# Patient Record
Sex: Female | Born: 1975 | Race: White | Hispanic: No | Marital: Married | Smoking: Former smoker
Health system: Southern US, Community
[De-identification: ages and names within clinical notes are randomized; demographics above are authoritative.]

## PROBLEM LIST (undated history)

## (undated) DIAGNOSIS — R51 Headache: Secondary | ICD-10-CM

---

## 2012-04-27 ENCOUNTER — Ambulatory Visit
Admission: RE | Admit: 2012-04-27 | Discharge: 2012-04-27 | Disposition: A | Discharge status: Home / Self Care | Payer: No Typology Code available for payment source | Source: Ambulatory Visit | Attending: Infectious Diseases | Admitting: Infectious Diseases

## 2012-04-27 ENCOUNTER — Other Ambulatory Visit: Payer: Self-pay | Admitting: Infectious Diseases

## 2012-04-27 DIAGNOSIS — R7611 Nonspecific reaction to tuberculin skin test without active tuberculosis: Secondary | ICD-10-CM

## 2013-10-10 IMAGING — CR DG CHEST 1V
1 series · 1 of 1 positions shown · non-contrast
Comparison: None.

CLINICAL DATA: Positive PPD

CHEST - 1 VIEW

[view not recorded]
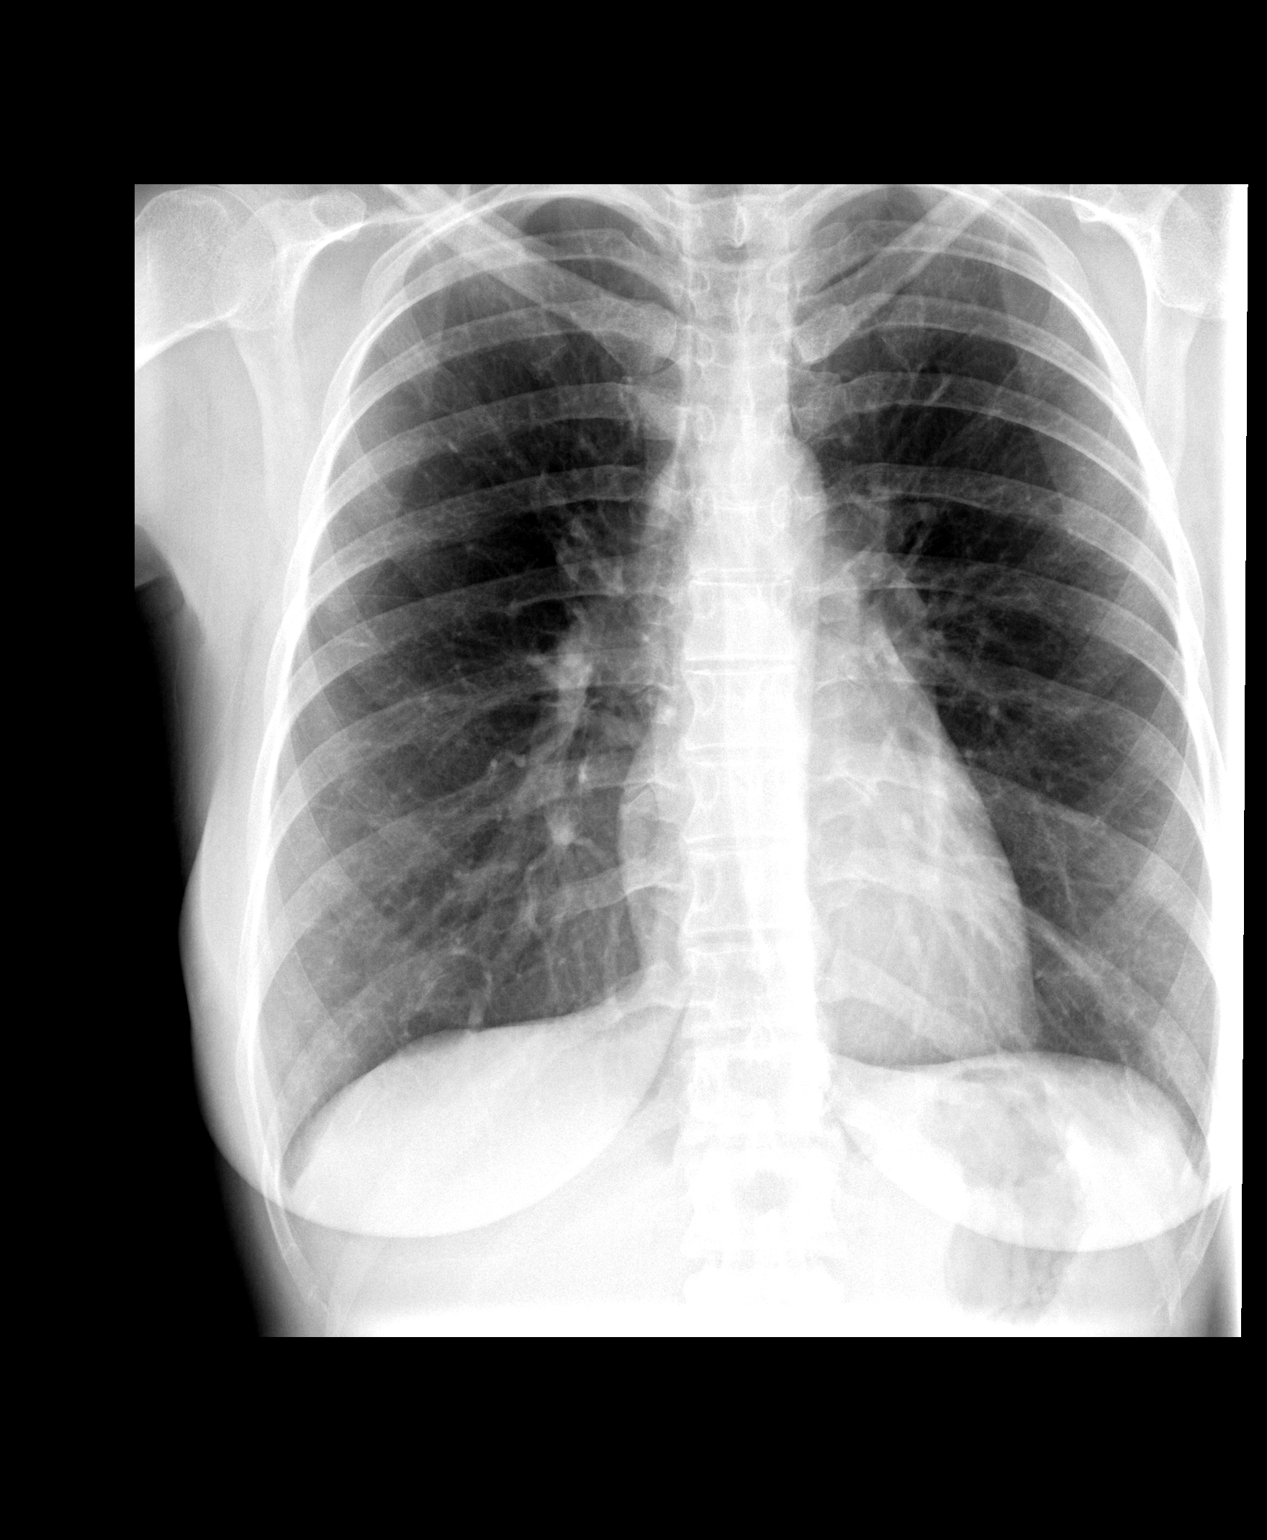

[1 of 1 positions shown; findings below may reference images not displayed]

FINDINGS: Heart size is normal.  Vascularity is normal.  Lungs are
clear without infiltrate or effusion.  No evidence of active
tuberculosis.
IMPRESSION: No active cardiopulmonary disease.

## 2013-12-01 ENCOUNTER — Encounter (HOSPITAL_COMMUNITY): Payer: Self-pay | Admitting: Pharmacist

## 2013-12-01 ENCOUNTER — Encounter (HOSPITAL_COMMUNITY): Payer: Self-pay

## 2013-12-03 ENCOUNTER — Other Ambulatory Visit: Payer: Self-pay | Admitting: Obstetrics & Gynecology

## 2013-12-08 ENCOUNTER — Ambulatory Visit (HOSPITAL_COMMUNITY)
Admission: RE | Admit: 2013-12-08 | Discharge: 2013-12-08 | Disposition: A | Discharge status: Home / Self Care | Payer: BC Managed Care – PPO | Source: Ambulatory Visit | Attending: Obstetrics & Gynecology | Admitting: Obstetrics & Gynecology

## 2013-12-08 ENCOUNTER — Encounter (HOSPITAL_COMMUNITY)
Admission: RE | Disposition: A | Discharge status: Home / Self Care | Payer: Self-pay | Source: Ambulatory Visit | Attending: Obstetrics & Gynecology

## 2013-12-08 ENCOUNTER — Encounter (HOSPITAL_COMMUNITY): Payer: Self-pay

## 2013-12-08 ENCOUNTER — Ambulatory Visit (HOSPITAL_COMMUNITY): Payer: BC Managed Care – PPO | Admitting: Anesthesiology

## 2013-12-08 ENCOUNTER — Encounter (HOSPITAL_COMMUNITY): Payer: BC Managed Care – PPO | Admitting: Anesthesiology

## 2013-12-08 DIAGNOSIS — Z302 Encounter for sterilization: Secondary | ICD-10-CM | POA: Insufficient documentation

## 2013-12-08 DIAGNOSIS — F17200 Nicotine dependence, unspecified, uncomplicated: Secondary | ICD-10-CM | POA: Insufficient documentation

## 2013-12-08 HISTORY — DX: Headache: R51

## 2013-12-08 HISTORY — PX: LAPAROSCOPIC TUBAL LIGATION: SHX1937

## 2013-12-08 LAB — CBC
HEMATOCRIT: 39.5 % (ref 36.0–46.0)
Hemoglobin: 13.1 g/dL (ref 12.0–15.0)
MCH: 29.7 pg (ref 26.0–34.0)
MCHC: 33.2 g/dL (ref 30.0–36.0)
MCV: 89.6 fL (ref 78.0–100.0)
Platelets: 234 10*3/uL (ref 150–400)
RBC: 4.41 MIL/uL (ref 3.87–5.11)
RDW: 12.8 % (ref 11.5–15.5)
WBC: 5.5 10*3/uL (ref 4.0–10.5)

## 2013-12-08 LAB — PREGNANCY, URINE: Preg Test, Ur: NEGATIVE

## 2013-12-08 SURGERY — LIGATION, FALLOPIAN TUBE, LAPAROSCOPIC
Anesthesia: General | Site: Abdomen | Laterality: Bilateral

## 2013-12-08 MED ORDER — PROPOFOL 10 MG/ML IV EMUL
INTRAVENOUS | Status: AC
  Filled 2013-12-08: qty 20

## 2013-12-08 MED ORDER — BUPIVACAINE HCL (PF) 0.25 % IJ SOLN
INTRAMUSCULAR | Status: AC
  Filled 2013-12-08: qty 10

## 2013-12-08 MED ORDER — ACETAMINOPHEN 160 MG/5ML PO SOLN
ORAL | Status: AC
  Filled 2013-12-08: qty 40.6

## 2013-12-08 MED ORDER — METOCLOPRAMIDE HCL 5 MG/ML IJ SOLN
10.0000 mg | Freq: Once | INTRAMUSCULAR | Status: AC | PRN
  Administered 2013-12-08: 10 mg via INTRAVENOUS

## 2013-12-08 MED ORDER — KETOROLAC TROMETHAMINE 30 MG/ML IJ SOLN
INTRAMUSCULAR | Status: AC
  Filled 2013-12-08: qty 1

## 2013-12-08 MED ORDER — FENTANYL CITRATE 0.05 MG/ML IJ SOLN
INTRAMUSCULAR | Status: AC
  Administered 2013-12-08: 50 ug via INTRAVENOUS
  Filled 2013-12-08: qty 2

## 2013-12-08 MED ORDER — FENTANYL CITRATE 0.05 MG/ML IJ SOLN
25.0000 ug | INTRAMUSCULAR | Status: DC | PRN
  Administered 2013-12-08: 50 ug via INTRAVENOUS

## 2013-12-08 MED ORDER — DEXAMETHASONE SODIUM PHOSPHATE 10 MG/ML IJ SOLN
INTRAMUSCULAR | Status: DC | PRN
  Administered 2013-12-08: 5 mg via INTRAVENOUS

## 2013-12-08 MED ORDER — ONDANSETRON HCL 4 MG/2ML IJ SOLN
INTRAMUSCULAR | Status: AC
  Filled 2013-12-08: qty 2

## 2013-12-08 MED ORDER — LIDOCAINE HCL (CARDIAC) 20 MG/ML IV SOLN
INTRAVENOUS | Status: AC
  Filled 2013-12-08: qty 5

## 2013-12-08 MED ORDER — KETOROLAC TROMETHAMINE 30 MG/ML IJ SOLN: 15.0000 mg | Freq: Once | INTRAMUSCULAR | Status: DC | PRN

## 2013-12-08 MED ORDER — MIDAZOLAM HCL 2 MG/2ML IJ SOLN
INTRAMUSCULAR | Status: DC | PRN
  Administered 2013-12-08: 2 mg via INTRAVENOUS

## 2013-12-08 MED ORDER — MEPERIDINE HCL 25 MG/ML IJ SOLN: 6.2500 mg | INTRAMUSCULAR | Status: DC | PRN

## 2013-12-08 MED ORDER — ROCURONIUM BROMIDE 100 MG/10ML IV SOLN
INTRAVENOUS | Status: DC | PRN
  Administered 2013-12-08: 30 mg via INTRAVENOUS

## 2013-12-08 MED ORDER — LIDOCAINE HCL (CARDIAC) 20 MG/ML IV SOLN
INTRAVENOUS | Status: DC | PRN
  Administered 2013-12-08: 50 mg via INTRAVENOUS

## 2013-12-08 MED ORDER — GLYCOPYRROLATE 0.2 MG/ML IJ SOLN
INTRAMUSCULAR | Status: DC | PRN
  Administered 2013-12-08: 0.4 mg via INTRAVENOUS

## 2013-12-08 MED ORDER — FENTANYL CITRATE 0.05 MG/ML IJ SOLN
INTRAMUSCULAR | Status: DC | PRN
  Administered 2013-12-08: 100 ug via INTRAVENOUS
  Administered 2013-12-08: 150 ug via INTRAVENOUS

## 2013-12-08 MED ORDER — NEOSTIGMINE METHYLSULFATE 1 MG/ML IJ SOLN
INTRAMUSCULAR | Status: AC
  Filled 2013-12-08: qty 1

## 2013-12-08 MED ORDER — NEOSTIGMINE METHYLSULFATE 1 MG/ML IJ SOLN
INTRAMUSCULAR | Status: DC | PRN
  Administered 2013-12-08: 3 mg via INTRAVENOUS

## 2013-12-08 MED ORDER — PROPOFOL 10 MG/ML IV BOLUS
INTRAVENOUS | Status: DC | PRN
  Administered 2013-12-08: 150 mg via INTRAVENOUS

## 2013-12-08 MED ORDER — MIDAZOLAM HCL 2 MG/2ML IJ SOLN
INTRAMUSCULAR | Status: AC
  Filled 2013-12-08: qty 2

## 2013-12-08 MED ORDER — ACETAMINOPHEN 160 MG/5ML PO SOLN
975.0000 mg | Freq: Once | ORAL | Status: AC
Start: 2013-12-08 — End: 2013-12-08
  Administered 2013-12-08: 975 mg via ORAL

## 2013-12-08 MED ORDER — FENTANYL CITRATE 0.05 MG/ML IJ SOLN
INTRAMUSCULAR | Status: AC
  Filled 2013-12-08: qty 5

## 2013-12-08 MED ORDER — HYDROCODONE-IBUPROFEN 5-200 MG PO TABS: 1.0000 | ORAL_TABLET | Freq: Three times a day (TID) | ORAL | Status: AC | PRN

## 2013-12-08 MED ORDER — METOCLOPRAMIDE HCL 5 MG/ML IJ SOLN
INTRAMUSCULAR | Status: AC
  Filled 2013-12-08: qty 2

## 2013-12-08 MED ORDER — KETOROLAC TROMETHAMINE 30 MG/ML IJ SOLN
INTRAMUSCULAR | Status: DC | PRN
  Administered 2013-12-08: 30 mg via INTRAVENOUS

## 2013-12-08 MED ORDER — ROCURONIUM BROMIDE 100 MG/10ML IV SOLN
INTRAVENOUS | Status: AC
  Filled 2013-12-08: qty 1

## 2013-12-08 MED ORDER — BUPIVACAINE HCL (PF) 0.25 % IJ SOLN
INTRAMUSCULAR | Status: DC | PRN
  Administered 2013-12-08: 10 mL

## 2013-12-08 MED ORDER — LACTATED RINGERS IV SOLN
INTRAVENOUS | Status: DC
  Administered 2013-12-08 (×3): via INTRAVENOUS

## 2013-12-08 MED ORDER — GLYCOPYRROLATE 0.2 MG/ML IJ SOLN
INTRAMUSCULAR | Status: AC
  Filled 2013-12-08: qty 2

## 2013-12-08 MED ORDER — ONDANSETRON HCL 4 MG/2ML IJ SOLN
INTRAMUSCULAR | Status: DC | PRN
Start: 2013-12-08 — End: 2013-12-08
  Administered 2013-12-08: 4 mg via INTRAVENOUS

## 2013-12-08 SURGICAL SUPPLY — 21 items
APPLICATOR COTTON TIP 6IN STRL (MISCELLANEOUS) IMPLANT
CATH ROBINSON RED A/P 16FR (CATHETERS) ×2 IMPLANT
CLOTH BEACON ORANGE TIMEOUT ST (SAFETY) ×2 IMPLANT
DERMABOND ADVANCED (GAUZE/BANDAGES/DRESSINGS) ×1
DERMABOND ADVANCED .7 DNX12 (GAUZE/BANDAGES/DRESSINGS) ×1 IMPLANT
ELECT REM PT RETURN 9FT ADLT (ELECTROSURGICAL) ×2
ELECTRODE REM PT RTRN 9FT ADLT (ELECTROSURGICAL) ×1 IMPLANT
GLOVE BIO SURGEON STRL SZ 6.5 (GLOVE) ×2 IMPLANT
GLOVE BIOGEL PI IND STRL 7.0 (GLOVE) ×2 IMPLANT
GLOVE BIOGEL PI INDICATOR 7.0 (GLOVE) ×2
GOWN STRL REUS W/TWL LRG LVL3 (GOWN DISPOSABLE) ×4 IMPLANT
PACK LAPAROSCOPY BASIN (CUSTOM PROCEDURE TRAY) ×2 IMPLANT
PAD OB MATERNITY 4.3X12.25 (PERSONAL CARE ITEMS) ×2 IMPLANT
PENCIL BUTTON HOLSTER BLD 10FT (ELECTRODE) ×2 IMPLANT
SUT MNCRL AB 4-0 PS2 18 (SUTURE) IMPLANT
SUT MON AB 4-0 PS1 27 (SUTURE) ×2 IMPLANT
SUT VICRYL 0 UR6 27IN ABS (SUTURE) ×2 IMPLANT
TOWEL OR 17X24 6PK STRL BLUE (TOWEL DISPOSABLE) ×4 IMPLANT
TROCAR BALLN 12MMX100 BLUNT (TROCAR) ×2 IMPLANT
WARMER LAPAROSCOPE (MISCELLANEOUS) ×2 IMPLANT
WATER STERILE IRR 1000ML POUR (IV SOLUTION) IMPLANT

## 2013-12-08 NOTE — Op Note (Signed)
12/08/2013  9:50 AM  PATIENT:  Colleen Nixon  38 y.o. female  PRE-OPERATIVE DIAGNOSIS:  Elective Sterilization  POST-OPERATIVE DIAGNOSIS:  Elective Sterilization  PROCEDURE:  Procedure(s): LAPAROSCOPIC TUBAL STERILIZATION with cautery  SURGEON:  Surgeon(s): Genia DelMarie-Lyne Shilo Pauwels, MD  ASSISTANTS: none   ANESTHESIA:   general  PROCEDURE:  Under general anesthesia with endotracheal intubation, the patient is in lithotomy position. She is prepped with ChloraPrep on the abdomen and with Betadine on the suprapubic, vulvar and vaginal areas. She is draped as usual. Vaginally the uterus is anteverted normal volume nonmetal mass. The speculum is inserted in the vagina the anterior lip of the cervix is grasped with a tenaculum and the uterus was cannulated with a 1 tooth cannula.  The tenaculum and speculum were removed from the cervix.  Abdominally an infiltration of Marcaine one quarter plain 10 cc was done infraumbilically. We made an incision with a scalpel over 1 cm at that level.  The aponeurosis was opened under direct vision with Mayo scissors. The peritoneum was opened bluntly with a finger. A pursestring stitch of Vicryl 0 is done on the aponeurosis. The Roseanne RenoHassan was inserted and a pneumoperitoneum was created with CO2.  The camera was inserted at that level on an operative scope with the probe.  The uterus is normal in size and appearance. Both tubes are normal in size and appearance, fimbria are visualized. Both ovaries are normal in size and appearance. Mild adhesion with the bowel is present on the left adnexa.  The appendix is normal to inspection as well as the liver. Pictures were taken of all those structures. We used the Kleppinger to cauterize the right tube at about 2 cm from the cornua and including the mesosalpinx.  We cauterized distal to this area and then in the middle.  We proceed exactly the same way on the left tube. Hemostasis was adequate at all levels. Pictures were taken of the  tubes after sterilization.  The Kleppinger instrument was removed.  The camera was removed as well.  The Roseanne RenoHassan was removed. The CO2 was evacuated thoroughly. The pursestring stitch was attached at the infraumbilical incision. We then completed hemostasis with the Bovie. We closed the skin with a subcuticular suture of Monocryl 4-0. Dermabond was added. The cannula was removed from the uterus. Hemostasis was adequate at that level as well. The patient was brought to recovery room in good and stable status.  ESTIMATED BLOOD LOSS:  10 CC   Intake/Output Summary (Last 24 hours) at 12/08/13 0950 Last data filed at 12/08/13 0910  Gross per 24 hour  Intake      0 ml  Output     20 ml  Net    -20 ml     BLOOD ADMINISTERED:none   LOCAL MEDICATIONS USED:  MARCAINE     SPECIMEN:  No Specimen  DISPOSITION OF SPECIMEN:  N/A  COUNTS:  YES  PLAN OF CARE: Transfer to PACU   Genia DelMarie-Lyne Johnanthony Wilden MD  12/08/2013 at 9:50 am

## 2013-12-08 NOTE — Anesthesia Procedure Notes (Signed)
Procedure Name: Intubation Date/Time: 12/08/2013 9:08 AM Performed by: Shanon PayorGREGORY, Colleen Nixon Pre-anesthesia Checklist: Suction available, Emergency Drugs available, Timeout performed, Patient identified and Patient being monitored Patient Re-evaluated:Patient Re-evaluated prior to inductionOxygen Delivery Method: Circle system utilized Preoxygenation: Pre-oxygenation with 100% oxygen Intubation Type: IV induction Ventilation: Mask ventilation without difficulty Laryngoscope Size: Mac and 3 Grade View: Grade I Tube type: Oral Tube size: 7.0 mm Number of attempts: 1 Airway Equipment and Method: Stylet Secured at: 22 cm Tube secured with: Tape Dental Injury: Teeth and Oropharynx as per pre-operative assessment

## 2013-12-08 NOTE — H&P (Signed)
Colleen Nixon is an 38 y.o. female  [id number]  married  RP:  Sterilization  Pertinent Gynecological History: Menses: flow is moderate Contraception: OCP (estrogen/progesterone) Blood transfusions: none Sexually transmitted diseases: no past history Last pap: normal  Menstrual History:  Patient's last menstrual period was 11/16/2013.    Past Medical History  Diagnosis Date  . Headache(784.0)     occ. migraines    Past Surgical History  Procedure Laterality Date  . Cesarean section          D+E  History reviewed. No pertinent family history.  Social History:  reports that she quit smoking about 14 years ago. Her smoking use included Cigarettes. She smoked 0.00 packs per day. She has never used smokeless tobacco. She reports that she drinks alcohol. She reports that she does not use illicit drugs.  Allergies: No Known Allergies  Prescriptions prior to admission  Medication Sig Dispense Refill  . Carboxymethylcell-Hypromellose (GENTEAL) 0.25-0.3 % GEL Place 1 application into the right eye at bedtime.      . Hypromellose (GENTEAL) 0.3 % SOLN Place 1 drop into the right eye 4 (four) times daily.      Marland Kitchen. PRESCRIPTION MEDICATION Take 1 tablet by mouth daily. [facility]-- Birth Control Medication from [place]        ROS  Blood pressure 119/75, pulse 78, temperature 97.7 F (36.5 C), temperature source Oral, resp. rate 18, height 5' (1.524 m), weight 54.432 kg (120 lb), last menstrual period 11/16/2013, SpO2 100.00%. Physical Exam  Results for orders placed during the hospital encounter of 12/08/13 (from the past 24 hour(s))  PREGNANCY, URINE     Status: None   Collection Time    12/08/13  7:45 AM      Result Value Ref Range   Preg Test, Ur NEGATIVE  NEGATIVE  CBC     Status: None   Collection Time    12/08/13  8:10 AM      Result Value Ref Range   WBC 5.5  4.0 - 10.5 K/uL   RBC 4.41  3.87 - 5.11 MIL/uL   Hemoglobin 13.1  12.0 - 15.0 g/dL   HCT 09.639.5  04.536.0 - 40.946.0 %   MCV  89.6  78.0 - 100.0 fL   MCH 29.7  26.0 - 34.0 pg   MCHC 33.2  30.0 - 36.0 g/dL   RDW 81.112.8  91.411.5 - 78.215.5 %   Platelets 234  150 - 400 K/uL    No results found.  Assessment/Plan: Desire for sterilization.  LPS BT/S by cautherization.  Surgery and risks reviewed.  Wilmon Conover,MARIE-LYNE 12/08/2013, 8:30 AM

## 2013-12-08 NOTE — Transfer of Care (Signed)
Immediate Anesthesia Transfer of Care Note  Patient: Colleen Nixon  Procedure(s) Performed: Procedure(s) with comments: LAPAROSCOPIC TUBAL LIGATION with cautery (Bilateral) - 1 hr.  Patient Location: PACU  Anesthesia Type:General  Level of Consciousness: awake, alert  and oriented  Airway & Oxygen Therapy: Patient Spontanous Breathing and Patient connected to nasal cannula oxygen  Post-op Assessment: Report given to PACU RN and Post -op Vital signs reviewed and stable  Post vital signs: Reviewed and stable  Complications: No apparent anesthesia complications

## 2013-12-08 NOTE — Discharge Instructions (Signed)
Laparoscopic Tubal Ligation °Care After °Refer to this sheet in the next few weeks. These instructions provide you with information on caring for yourself after your procedure. Your caregiver may also give you more specific instructions. Your treatment has been planned according to current medical practices, but problems sometimes occur. Call your caregiver if you have any problems or questions after your procedure. °HOME CARE INSTRUCTIONS  °· Rest the remainder of the day. °· Only take over-the-counter or prescription medicines for pain, discomfort, or fever as directed by your caregiver. Do not take aspirin. It can cause bleeding. °· Gradually resume daily activities, diet, rest, driving, and work. °· Avoid sexual intercourse for 2 weeks or as directed. °· Do not use tampons or douche. °· Do not drive while taking pain medicine. °· Do not lift anything over 5 pounds for 2 weeks or as directed. °· Only take showers, not baths, until you are seen by your caregiver. °· Change bandages (dressings) as directed. °· Take your temperature twice a day and record it. °· Try to have help for the first 7 to 10 days for your household needs. °· Return to your caregiver to get your stitches (sutures) removed and for follow-up visits as directed. °SEEK MEDICAL CARE IF:  °· You have redness, swelling, or increasing pain in a wound. °· You have drainage from a wound lasting longer than 1 day. °· Your pain is getting worse. °· You have a rash. °· You become dizzy or lightheaded. °· You have a reaction to your medicine. °· You need stronger medicine or a change in your pain medicine. °· You notice a bad smell coming from a wound or dressing. °· Your wound breaks open after the sutures have been removed. °· You are constipated. °SEEK IMMEDIATE MEDICAL CARE IF:  °· You faint. °· You have a fever. °· You have increasing abdominal pain. °· You have severe pain in your shoulders. °· You have bleeding or drainage from the suture sites or  vagina following surgery. °· You have shortness of breath or difficulty breathing. °· You have chest or leg pain. °· You have persistent nausea, vomiting, or diarrhea. °MAKE SURE YOU:  °· Understand these instructions. °· Watch your condition. °· Get help right away if you are not doing well or get worse. °Document Released: 04/12/2005 Document Revised: 03/24/2012 Document Reviewed: 01/04/2012 °Exit[employer]® Patient Information ©2014 [employer]. ° °

## 2013-12-08 NOTE — Discharge Summary (Signed)
  Physician Discharge Summary  Patient ID: Colleen Nixon MRN: [id number] DOB/AGE: 37/03/1976 37 y.o.  Admit date: 12/08/2013 Discharge date: 12/08/2013  Admission Diagnoses: Elective Sterilization  Discharge Diagnoses: Elective Sterilization        Active Problems:   * No active hospital problems. *   Discharged Condition: good  Hospital Course: good  Consults: None  Treatments: surgery: Laparoscopic bilateral tubal sterilization with cauterization  Disposition: Final discharge disposition not confirmed     Medication List    STOP taking these medications       PRESCRIPTION MEDICATION      TAKE these medications       GENTEAL 0.25-0.3 % Gel  Generic drug:  Carboxymethylcell-Hypromellose  Place 1 application into the right eye at bedtime.     GENTEAL 0.3 % Soln  Generic drug:  Hypromellose  Place 1 drop into the right eye 4 (four) times daily.     hydrocodone-ibuprofen 5-200 MG per tablet  Commonly known as:  VICOPROFEN  Take 1 tablet by mouth every 8 (eight) hours as needed for pain.         SignedGenia Del: Jaylee Lantry,MARIE-LYNE, MD 12/08/2013, 10:02 AM

## 2013-12-08 NOTE — Anesthesia Preprocedure Evaluation (Signed)
Anesthesia Evaluation  Patient identified by MRN, date of birth, ID band Patient awake    Reviewed: Allergy & Precautions, H&P , NPO status , Patient's Chart, lab work & pertinent test results, reviewed documented beta blocker date and time   History of Anesthesia Complications Negative for: history of anesthetic complications  Airway Mallampati: III TM Distance: >3 FB Neck ROM: full    Dental  (+) Teeth Intact   Pulmonary Current Smoker,  breath sounds clear to auscultation  Pulmonary exam normal       Cardiovascular negative cardio ROS  Rhythm:regular Rate:Normal     Neuro/Psych  Headaches (migraines - occasional), negative psych ROS   GI/Hepatic negative GI ROS, Neg liver ROS,   Endo/Other  negative endocrine ROS  Renal/GU negative Renal ROS  Female GU complaint     Musculoskeletal   Abdominal   Peds  Hematology negative hematology ROS (+)   Anesthesia Other Findings   Reproductive/Obstetrics negative OB ROS                           Anesthesia Physical Anesthesia Plan  ASA: II  Anesthesia Plan: General ETT   Post-op Pain Management:    Induction:   Airway Management Planned:   Additional Equipment:   Intra-op Plan:   Post-operative Plan:   Informed Consent: I have reviewed the patients History and Physical, chart, labs and discussed the procedure including the risks, benefits and alternatives for the proposed anesthesia with the patient or authorized representative who has indicated his/her understanding and acceptance.   Dental Advisory Given  Plan Discussed with: CRNA and Surgeon  Anesthesia Plan Comments:         Anesthesia Quick Evaluation

## 2013-12-09 ENCOUNTER — Encounter (HOSPITAL_COMMUNITY): Payer: Self-pay | Admitting: Obstetrics & Gynecology

## 2013-12-09 NOTE — Anesthesia Postprocedure Evaluation (Signed)
  Anesthesia Post-op Note  Anesthesia Post Note  Patient: Colleen Nixon  Procedure(s) Performed: Procedure(s) (LRB): LAPAROSCOPIC TUBAL LIGATION with cautery (Bilateral)  Anesthesia type: General  Patient location: PACU  Post pain: Pain level controlled  Post assessment: Post-op Vital signs reviewed  Last Vitals:  Filed Vitals:   12/08/13 1129  BP: 97/61  Pulse: 60  Temp: 36.5 C  Resp: 18    Post vital signs: Reviewed  Level of consciousness: sedated  Complications: No apparent anesthesia complications

## 2016-03-26 DIAGNOSIS — L738 Other specified follicular disorders: Secondary | ICD-10-CM | POA: Diagnosis not present

## 2016-03-26 DIAGNOSIS — L718 Other rosacea: Secondary | ICD-10-CM | POA: Diagnosis not present

## 2016-03-26 DIAGNOSIS — H04123 Dry eye syndrome of bilateral lacrimal glands: Secondary | ICD-10-CM | POA: Diagnosis not present

## 2016-11-02 DIAGNOSIS — J111 Influenza due to unidentified influenza virus with other respiratory manifestations: Secondary | ICD-10-CM | POA: Diagnosis not present

## 2017-01-25 DIAGNOSIS — J3089 Other allergic rhinitis: Secondary | ICD-10-CM | POA: Diagnosis not present

## 2017-01-25 DIAGNOSIS — J45998 Other asthma: Secondary | ICD-10-CM | POA: Diagnosis not present

## 2017-01-30 DIAGNOSIS — R062 Wheezing: Secondary | ICD-10-CM | POA: Diagnosis not present

## 2017-01-30 DIAGNOSIS — J301 Allergic rhinitis due to pollen: Secondary | ICD-10-CM | POA: Diagnosis not present

## 2017-03-19 ENCOUNTER — Ambulatory Visit: Payer: Self-pay | Admitting: Allergy

## 2017-03-31 DIAGNOSIS — L718 Other rosacea: Secondary | ICD-10-CM | POA: Diagnosis not present

## 2017-03-31 DIAGNOSIS — L738 Other specified follicular disorders: Secondary | ICD-10-CM | POA: Diagnosis not present

## 2017-12-08 DIAGNOSIS — L718 Other rosacea: Secondary | ICD-10-CM | POA: Diagnosis not present

## 2019-12-09 ENCOUNTER — Ambulatory Visit: Payer: Self-pay | Attending: Internal Medicine

## 2019-12-09 DIAGNOSIS — Z23 Encounter for immunization: Secondary | ICD-10-CM

## 2019-12-09 NOTE — Progress Notes (Signed)
   Covid-19 Vaccination Clinic  Name:  Yesica Kemler    MRN: [id number] DOB: 08/05/1976  12/09/2019  Ms. Glennie was observed post Covid-19 immunization for 15 minutes without incident. She was provided with Vaccine Information Sheet and instruction to access the V-Safe system.   Ms. Dicenso was instructed to call 911 with any severe reactions post vaccine: Marland Kitchen Difficulty breathing  . Swelling of face and throat  . A fast heartbeat  . A bad rash all over body  . Dizziness and weakness   Immunizations Administered    Name Date Dose VIS Date Route   Pfizer COVID-19 Vaccine 12/09/2019  6:03 PM 0.3 mL 09/17/2019 Intramuscular   Manufacturer: [employer], [employer]   Lot: [id number]   NDC: [address]-8

## 2020-01-05 ENCOUNTER — Ambulatory Visit: Payer: Self-pay | Attending: Internal Medicine

## 2020-01-05 DIAGNOSIS — Z23 Encounter for immunization: Secondary | ICD-10-CM

## 2020-01-05 NOTE — Progress Notes (Signed)
   Covid-19 Vaccination Clinic  Name:  Colleen Nixon    MRN: [id number] DOB: Aug 16, 1976  01/05/2020  [name] was observed post Covid-19 immunization for 15 minutes without incident. She was provided with Vaccine Information Sheet and instruction to access the V-Safe system.   [name] was instructed to call 911 with any severe reactions post vaccine: Marland Kitchen Difficulty breathing  . Swelling of face and throat  . A fast heartbeat  . A bad rash all over body  . Dizziness and weakness   Immunizations Administered    Name Date Dose VIS Date Route   Pfizer COVID-19 Vaccine 01/05/2020  4:18 PM 0.3 mL 09/17/2019 Intramuscular   Manufacturer: [employer], [employer]   Lot: [id number]   NDC: [address]-3

## 2024-01-13 ENCOUNTER — Other Ambulatory Visit (HOSPITAL_BASED_OUTPATIENT_CLINIC_OR_DEPARTMENT_OTHER): Payer: Self-pay | Admitting: General Practice

## 2024-01-13 DIAGNOSIS — Z1231 Encounter for screening mammogram for malignant neoplasm of breast: Secondary | ICD-10-CM

## 2024-01-26 ENCOUNTER — Inpatient Hospital Stay (HOSPITAL_BASED_OUTPATIENT_CLINIC_OR_DEPARTMENT_OTHER): Admission: RE | Admit: 2024-01-26 | Payer: Self-pay | Source: Ambulatory Visit | Admitting: Radiology
